# Patient Record
Sex: Female | Born: 1975 | Race: Black or African American | Hispanic: No | Smoking: Never smoker
Health system: Southern US, Community
[De-identification: ages and names within clinical notes are randomized; demographics above are authoritative.]

## PROBLEM LIST (undated history)

## (undated) DIAGNOSIS — G43909 Migraine, unspecified, not intractable, without status migrainosus: Secondary | ICD-10-CM

## (undated) DIAGNOSIS — R569 Unspecified convulsions: Secondary | ICD-10-CM

## (undated) HISTORY — PX: CHOLECYSTECTOMY: SHX55

---

## 2017-04-30 ENCOUNTER — Emergency Department: Payer: Self-pay

## 2017-04-30 ENCOUNTER — Emergency Department
Admission: EM | Admit: 2017-04-30 | Discharge: 2017-04-30 | Disposition: A | Discharge status: Home / Self Care | Payer: Self-pay | Attending: Emergency Medicine | Admitting: Emergency Medicine

## 2017-04-30 DIAGNOSIS — R569 Unspecified convulsions: Secondary | ICD-10-CM | POA: Insufficient documentation

## 2017-04-30 DIAGNOSIS — G8384 Todd's paralysis (postepileptic): Secondary | ICD-10-CM | POA: Insufficient documentation

## 2017-04-30 DIAGNOSIS — Z79899 Other long term (current) drug therapy: Secondary | ICD-10-CM | POA: Insufficient documentation

## 2017-04-30 HISTORY — DX: Migraine, unspecified, not intractable, without status migrainosus: G43.909

## 2017-04-30 HISTORY — DX: Unspecified convulsions: R56.9

## 2017-04-30 LAB — HEPATIC FUNCTION PANEL
ALK PHOS: 63 U/L (ref 38–126)
ALT: 15 U/L (ref 14–54)
AST: 26 U/L (ref 15–41)
Albumin: 3.6 g/dL (ref 3.5–5.0)
Bilirubin, Direct: <0.1 mg/dL — ABNORMAL LOW (ref 0.1–0.5)
TOTAL PROTEIN: 7.4 g/dL (ref 6.5–8.1)
Total Bilirubin: 0.6 mg/dL (ref 0.3–1.2)

## 2017-04-30 LAB — BASIC METABOLIC PANEL
ANION GAP: 7 (ref 5–15)
BUN: 8 mg/dL (ref 6–20)
CHLORIDE: 109 mmol/L (ref 101–111)
CO2: 23 mmol/L (ref 22–32)
Calcium: 8.8 mg/dL — ABNORMAL LOW (ref 8.9–10.3)
Creatinine, Ser: 0.84 mg/dL (ref 0.44–1.00)
GFR calc Af Amer: >60 mL/min (ref >60)
GLUCOSE: 85 mg/dL (ref 65–99)
POTASSIUM: 3.5 mmol/L (ref 3.5–5.1)
SODIUM: 139 mmol/L (ref 135–145)

## 2017-04-30 LAB — URINALYSIS, COMPLETE (UACMP) WITH MICROSCOPIC
Bacteria, UA: NONE SEEN
Bilirubin Urine: NEGATIVE
GLUCOSE, UA: NEGATIVE mg/dL
Hgb urine dipstick: NEGATIVE
Ketones, ur: NEGATIVE mg/dL
Leukocytes, UA: NEGATIVE
NITRITE: NEGATIVE
PH: 5 (ref 5.0–8.0)
Protein, ur: NEGATIVE mg/dL
Specific Gravity, Urine: 1.023 (ref 1.005–1.030)

## 2017-04-30 LAB — CBC WITH DIFFERENTIAL/PLATELET
BASOS ABS: 0 10*3/uL (ref 0–0.1)
BASOS PCT: 1 %
EOS ABS: 0.1 10*3/uL (ref 0–0.7)
EOS PCT: 3 %
HCT: 26.2 % — ABNORMAL LOW (ref 35.0–47.0)
HEMOGLOBIN: 8.2 g/dL — AB (ref 12.0–16.0)
LYMPHS PCT: 23 %
Lymphs Abs: 1.2 10*3/uL (ref 1.0–3.6)
MCH: 20.6 pg — ABNORMAL LOW (ref 26.0–34.0)
MCHC: 31.4 g/dL — AB (ref 32.0–36.0)
MCV: 65.6 fL — ABNORMAL LOW (ref 80.0–100.0)
MONO ABS: 0.4 10*3/uL (ref 0.2–0.9)
MONOS PCT: 9 %
NEUTROS ABS: 3.2 10*3/uL (ref 1.4–6.5)
Neutrophils Relative %: 64 %
Platelets: 302 10*3/uL (ref 150–440)
RBC: 4 MIL/uL (ref 3.80–5.20)
RDW: 20.7 % — AB (ref 11.5–14.5)
WBC: 5 10*3/uL (ref 3.6–11.0)

## 2017-04-30 LAB — HCG, QUANTITATIVE, PREGNANCY

## 2017-04-30 LAB — GLUCOSE, CAPILLARY: Glucose-Capillary: 111 mg/dL — ABNORMAL HIGH (ref 65–99)

## 2017-04-30 MED ORDER — LORAZEPAM 2 MG/ML IJ SOLN
2.0000 mg | Freq: Once | INTRAMUSCULAR | Status: AC
  Administered 2017-04-30: 2 mg via INTRAMUSCULAR
  Filled 2017-04-30: qty 1

## 2017-04-30 MED ORDER — SODIUM CHLORIDE 0.9 % IV SOLN
2000.0000 mg | Freq: Once | INTRAVENOUS | Status: AC
  Administered 2017-04-30: 2000 mg via INTRAVENOUS
  Filled 2017-04-30: qty 20

## 2017-04-30 NOTE — ED Triage Notes (Signed)
Pt brought in by [facility] from home.  Pt has hx of seizures and migraines that bring on seizures.  Pt currently taking  of keppra daily.  Pt having seizures off/on for 30-45 minutes prior to EMS arrival.  EMS gave  versed intranasal in route.  Pt had 3 more focal seizures in route with EMS.  Pt became responsive after arrival to ER.  Pt having drooping and numbness to L side of face.  Pt also having L side weakness.

## 2017-04-30 NOTE — ED Notes (Signed)
Pt discharged to home.  Family member driving.  Discharge instructions reviewed.  Verbalized understanding.  No questions or concerns at this time.  Teach back verified.  Pt in NAD.  No items left in ED.   

## 2017-04-30 NOTE — ED Provider Notes (Signed)
[facility] Emergency Department Provider Note  ____________________________________________   First MD Initiated Contact with Patient 04/30/17 1927     (approximate)  I have reviewed the triage vital signs and the nursing notes.   HISTORY  Chief Complaint Seizures  level V exemption history Limited by the patient's clinical condition  HPI Makayla Davis is a 41 y.o. female who comes to the emergency department via EMS after having multiple generalized tonic-clonic seizures over the past 45 minutes and not returning back to baseline. She is reported history of seizure disorder for a she only takes Keppra. EMS noted 3 generalized tonic-clonic seizures in route. They gave her 2 mg of midazolam as a lamb intranasally and she had her third seizure following this.   Past Medical History:  Diagnosis Date  . Migraines   . Seizures (HCC)     There are no active problems to display for this patient.   Past Surgical History:  Procedure Laterality Date  . CHOLECYSTECTOMY      Prior to Admission medications   Medication Sig Start Date End Date Taking? Authorizing Provider  citalopram (CELEXA) 40 MG tablet Take 40 mg by mouth daily.   Yes [provider]  cyclobenzaprine (FLEXERIL) 5 MG tablet Take 5 mg by mouth 2 (two) times daily as needed.   Yes [provider]  gabapentin (NEURONTIN) 600 MG tablet Take 600 mg by mouth 3 (three) times daily as needed.   Yes [provider]  levETIRAcetam (KEPPRA) 1000 MG tablet Take 1,000 mg by mouth 2 (two) times daily.   Yes [provider]  LORazepam (ATIVAN) 1 MG tablet Take 1 mg by mouth 2 (two) times daily as needed for anxiety.   Yes [provider]  naproxen (NAPROSYN) 500 MG tablet Take 500 mg by mouth 2 (two) times daily as needed for pain.   Yes [provider]    Allergies Patient has no known allergies.  No family history on file.  Social History Social  History  Substance Use Topics  . Smoking status: Never Smoker  . Smokeless tobacco: Not on file  . Alcohol use No    Review of Systems level V exemption history Limited by the patient's clinical condition  ____________________________________________   PHYSICAL EXAM:  VITAL SIGNS: ED Triage Vitals  Enc Vitals Group     BP      Pulse      Resp      Temp      Temp src      SpO2      Weight      Height      Head Circumference      Peak Flow      Pain Score      Pain Loc      Pain Edu?      Excl. in GC?     Constitutional: actively seizing with eyes deviated to the left with generalized tonic-clonic motion Eyes: PERRL EOMI. pupils midrange Head: Atraumatic. Nose: No congestion/rhinnorhea. Mouth/Throat: No trismus Neck: No stridor.   Cardiovascular: tachycardicrate, regular rhythm. Grossly normal heart sounds.  Good peripheral circulation. Respiratory: shallow frequent breaths lungs sound coarse Gastrointestinal: obese soft nontender Musculoskeletal: No lower extremity edema   Neurologic:  actively seizing Skin:  Skin is warm, dry and intact. No rash noted. Psychiatric: actively seizing    ____________________________________________   DIFFERENTIAL includes but not limited to  seizure disorder, breakthrough seizure, medication noncompliance, infection ____________________________________________   LABS (  all labs ordered are listed, but only abnormal results are displayed)  Labs Reviewed  BASIC METABOLIC PANEL - Abnormal; Notable for the following:       Result Value   Calcium 8.8 (*)    All other components within normal limits  HEPATIC FUNCTION PANEL - Abnormal; Notable for the following:    Bilirubin, Direct <0.1 (*)    All other components within normal limits  CBC WITH DIFFERENTIAL/PLATELET - Abnormal; Notable for the following:    Hemoglobin 8.2 (*)    HCT 26.2 (*)    MCV 65.6 (*)    MCH 20.6 (*)    MCHC 31.4 (*)    RDW 20.7 (*)    All other  components within normal limits  URINALYSIS, COMPLETE (UACMP) WITH MICROSCOPIC - Abnormal; Notable for the following:    Color, Urine YELLOW (*)    APPearance CLEAR (*)    Squamous Epithelial / LPF 0-5 (*)    All other components within normal limits  GLUCOSE, CAPILLARY - Abnormal; Notable for the following:    Glucose-Capillary 111 (*)    All other components within normal limits  HCG, QUANTITATIVE, PREGNANCY    blood work reviewed and interpreted by me shows microcytic anemia the labs otherwise unremarkable and she is not pregnant __________________________________________  EKG  ED ECG REPORT I, Merrily Brittle, the attending physician, personally viewed and interpreted this ECG.  Date: 04/30/2017 EKG Time:  Rate: 88 Rhythm: normal sinus rhythm QRS Axis: normal Intervals: normal ST/T Wave abnormalities: normal Narrative Interpretation: no evidence of acute ischemia  ____________________________________________  RADIOLOGY  Head CT reviewed by me shows no acute disease Chest x-ray reviewed by me shows no acute disease ____________________________________________   PROCEDURES  Procedure(s) performed: yes  Angiocath insertion Performed by: Merrily Brittle  Consent: Verbal consent obtained. Risks and benefits: risks, benefits and alternatives were discussed Time out: Immediately prior to procedure a "time out" was called to verify the correct patient, procedure, equipment, support staff and site/side marked as required.  Preparation: Patient was prepped and draped in the usual sterile fashion.  Vein Location: right external jugular   Gauge: 20  Normal blood return and flush without difficulty Patient tolerance: Patient tolerated the procedure well with no immediate complications.     Procedures  Critical Care performed: no  Observation: no ____________________________________________   INITIAL IMPRESSION / ASSESSMENT AND PLAN / ED COURSE  Pertinent  labs & imaging results that were available during my care of the patient were reviewed by me and considered in my medical decision making (see chart for details).  On arrival the patient was actively seizing concerning for status epilepticus. Intramuscular Ativan given for her seizure and I was able to obtain IV access through a right external jugular IV. The patient's seizure terminated and she woke up appropriately several minutes thereafter. She does report a recent history of upper respiratory tract infection and cough. Labs are pending.     ----------------------------------------- 8:02 PM on 04/30/2017 -----------------------------------------  The patient is now awake following her seizures. She does have left facial droop along with weakness in her left arm particularly her left leg. 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 She's never had a stroke. I do have a consult to neurology to discuss.   ----------------------------------------- 8:08 PM on 04/30/2017 -----------------------------------------  I discussed the case with Dr. Amada Jupiter on call for [facility] neurology  who recommended loading the patient with additional Keppra. He indicated that it is typical for Todd's paralysis to last up to a full hour following a seizure, however it is not uncommon for it to last significantly longer than that if the patient has had multiple seizures. ____________________________________________   ----------------------------------------- 9:40 PM on 04/30/2017 -----------------------------------------  The patient is now awake, appropriate, and her strength is back to normal. She normally gets her neurological care at the [facility] in [place] and she understands she is to follow-up within 1 week.  FINAL CLINICAL IMPRESSION(S) / ED DIAGNOSES  Final diagnoses:  Seizure  (HCC)  Todd's paralysis (postepileptic) (HCC)      NEW MEDICATIONS STARTED DURING THIS VISIT:  Discharge Medication List as of 04/30/2017  9:39 PM       Note:  This document was prepared using Dragon voice recognition software and may include unintentional dictation errors.     Merrily Brittle, MD 04/30/17 2356

## 2017-04-30 NOTE — Discharge Instructions (Signed)
Please make sure you remain well-hydrated and take your Keppra as prescribed. Please make an appointment to follow-up with your neurologist within 1 week for reexamination and return to the emergency department for any concerns whatsoever.  It was a pleasure to take care of you today, and thank you for coming to our emergency department.  If you have any questions or concerns before leaving please ask the nurse to grab me and I'm more than happy to go through your aftercare instructions again.  If you were prescribed any opioid pain medication today such as Norco, Vicodin, Percocet, morphine, hydrocodone, or oxycodone please make sure you do not drive when you are taking this medication as it can alter your ability to drive safely.  If you have any concerns once you are home that you are not improving or are in fact getting worse before you can make it to your follow-up appointment, please do not hesitate to call 911 and come back for further evaluation.  Merrily Brittle, MD  Results for orders placed or performed during the hospital encounter of 04/30/17  hCG, quantitative, pregnancy  Result Value Ref Range   hCG, Beta Chain, Quant, S <1 <5 mIU/mL  Basic metabolic panel  Result Value Ref Range   Sodium 139 135 - 145 mmol/L   Potassium 3.5 3.5 - 5.1 mmol/L   Chloride 109 101 - 111 mmol/L   CO2 23 22 - 32 mmol/L   Glucose, Bld 85 65 - 99 mg/dL   BUN 8 6 - 20 mg/dL   Creatinine, Ser 1.61 0.44 - 1.00 mg/dL   Calcium 8.8 (L) 8.9 - 10.3 mg/dL   GFR calc non Af Amer >60 >60 mL/min   GFR calc Af Amer >60 >60 mL/min   Anion gap 7 5 - 15  Hepatic function panel  Result Value Ref Range   Total Protein 7.4 6.5 - 8.1 g/dL   Albumin 3.6 3.5 - 5.0 g/dL   AST 26 15 - 41 U/L   ALT 15 14 - 54 U/L   Alkaline Phosphatase 63 38 - 126 U/L   Total Bilirubin 0.6 0.3 - 1.2 mg/dL   Bilirubin, Direct <0.9 (L) 0.1 - 0.5 mg/dL   Indirect Bilirubin NOT CALCULATED 0.3 - 0.9 mg/dL  CBC with Differential    Result Value Ref Range   WBC 5.0 3.6 - 11.0 K/uL   RBC 4.00 3.80 - 5.20 MIL/uL   Hemoglobin 8.2 (L) 12.0 - 16.0 g/dL   HCT 60.4 (L) 54.0 - 98.1 %   MCV 65.6 (L) 80.0 - 100.0 fL   MCH 20.6 (L) 26.0 - 34.0 pg   MCHC 31.4 (L) 32.0 - 36.0 g/dL   RDW 19.1 (H) 47.8 - 29.5 %   Platelets 302 150 - 440 K/uL   Neutrophils Relative % 64 %   Neutro Abs 3.2 1.4 - 6.5 K/uL   Lymphocytes Relative 23 %   Lymphs Abs 1.2 1.0 - 3.6 K/uL   Monocytes Relative 9 %   Monocytes Absolute 0.4 0.2 - 0.9 K/uL   Eosinophils Relative 3 %   Eosinophils Absolute 0.1 0 - 0.7 K/uL   Basophils Relative 1 %   Basophils Absolute 0.0 0 - 0.1 K/uL  Urinalysis, Complete w Microscopic  Result Value Ref Range   Color, Urine YELLOW (A) YELLOW   APPearance CLEAR (A) CLEAR   Specific Gravity, Urine 1.023 1.005 - 1.030   pH 5.0 5.0 - 8.0   Glucose, UA NEGATIVE NEGATIVE mg/dL  Hgb urine dipstick NEGATIVE NEGATIVE   Bilirubin Urine NEGATIVE NEGATIVE   Ketones, ur NEGATIVE NEGATIVE mg/dL   Protein, ur NEGATIVE NEGATIVE mg/dL   Nitrite NEGATIVE NEGATIVE   Leukocytes, UA NEGATIVE NEGATIVE   RBC / HPF 0-5 0 - 5 RBC/hpf   WBC, UA 0-5 0 - 5 WBC/hpf   Bacteria, UA NONE SEEN NONE SEEN   Squamous Epithelial / LPF 0-5 (A) NONE SEEN   Mucus PRESENT    Hyaline Casts, UA PRESENT   Glucose, capillary  Result Value Ref Range   Glucose-Capillary 111 (H) 65 - 99 mg/dL   Ct Head Wo Contrast  Result Date: 04/30/2017 CLINICAL DATA:  Seizures, migraine, altered level of consciousness EXAM: CT HEAD WITHOUT CONTRAST TECHNIQUE: Contiguous axial images were obtained from the base of the skull through the vertex without intravenous contrast. Sagittal and coronal MPR images reconstructed from axial data set. Next filled none COMPARISON:  None. FINDINGS: Brain: Normal ventricular morphology. No midline shift or mass effect. Normal appearance of brain parenchyma. No intracranial hemorrhage, mass lesion, evidence of acute infarction, or  extra-axial fluid collection. Vascular: Unremarkable Skull: Intact Sinuses/Orbits: Clear Other: N/A IMPRESSION: No acute intracranial abnormalities. Electronically Signed   By: Ulyses Southward M.D.   On: 04/30/2017 19:54   Dg Chest Port 1 View  Result Date: 04/30/2017 CLINICAL DATA:  Acute onset of intermittent focal seizures and left-sided facial numbness and drooping. Generalized left-sided weakness. Initial encounter. EXAM: PORTABLE CHEST 1 VIEW COMPARISON:  None. FINDINGS: The lungs are well-aerated and clear. There is no evidence of focal opacification, pleural effusion or pneumothorax. The cardiomediastinal silhouette is within normal limits. No acute osseous abnormalities are seen. IMPRESSION: No acute cardiopulmonary process seen. Electronically Signed   By: Roanna Raider M.D.   On: 04/30/2017 20:15

## 2018-07-03 IMAGING — DX DG CHEST 1V PORT
1 series · 1 of 1 positions shown · non-contrast
Comparison: None.

CLINICAL DATA: Acute onset of intermittent focal seizures and
left-sided facial numbness and drooping. Generalized left-sided
weakness. Initial encounter.

EXAM:
PORTABLE CHEST 1 VIEW

[chest ap]
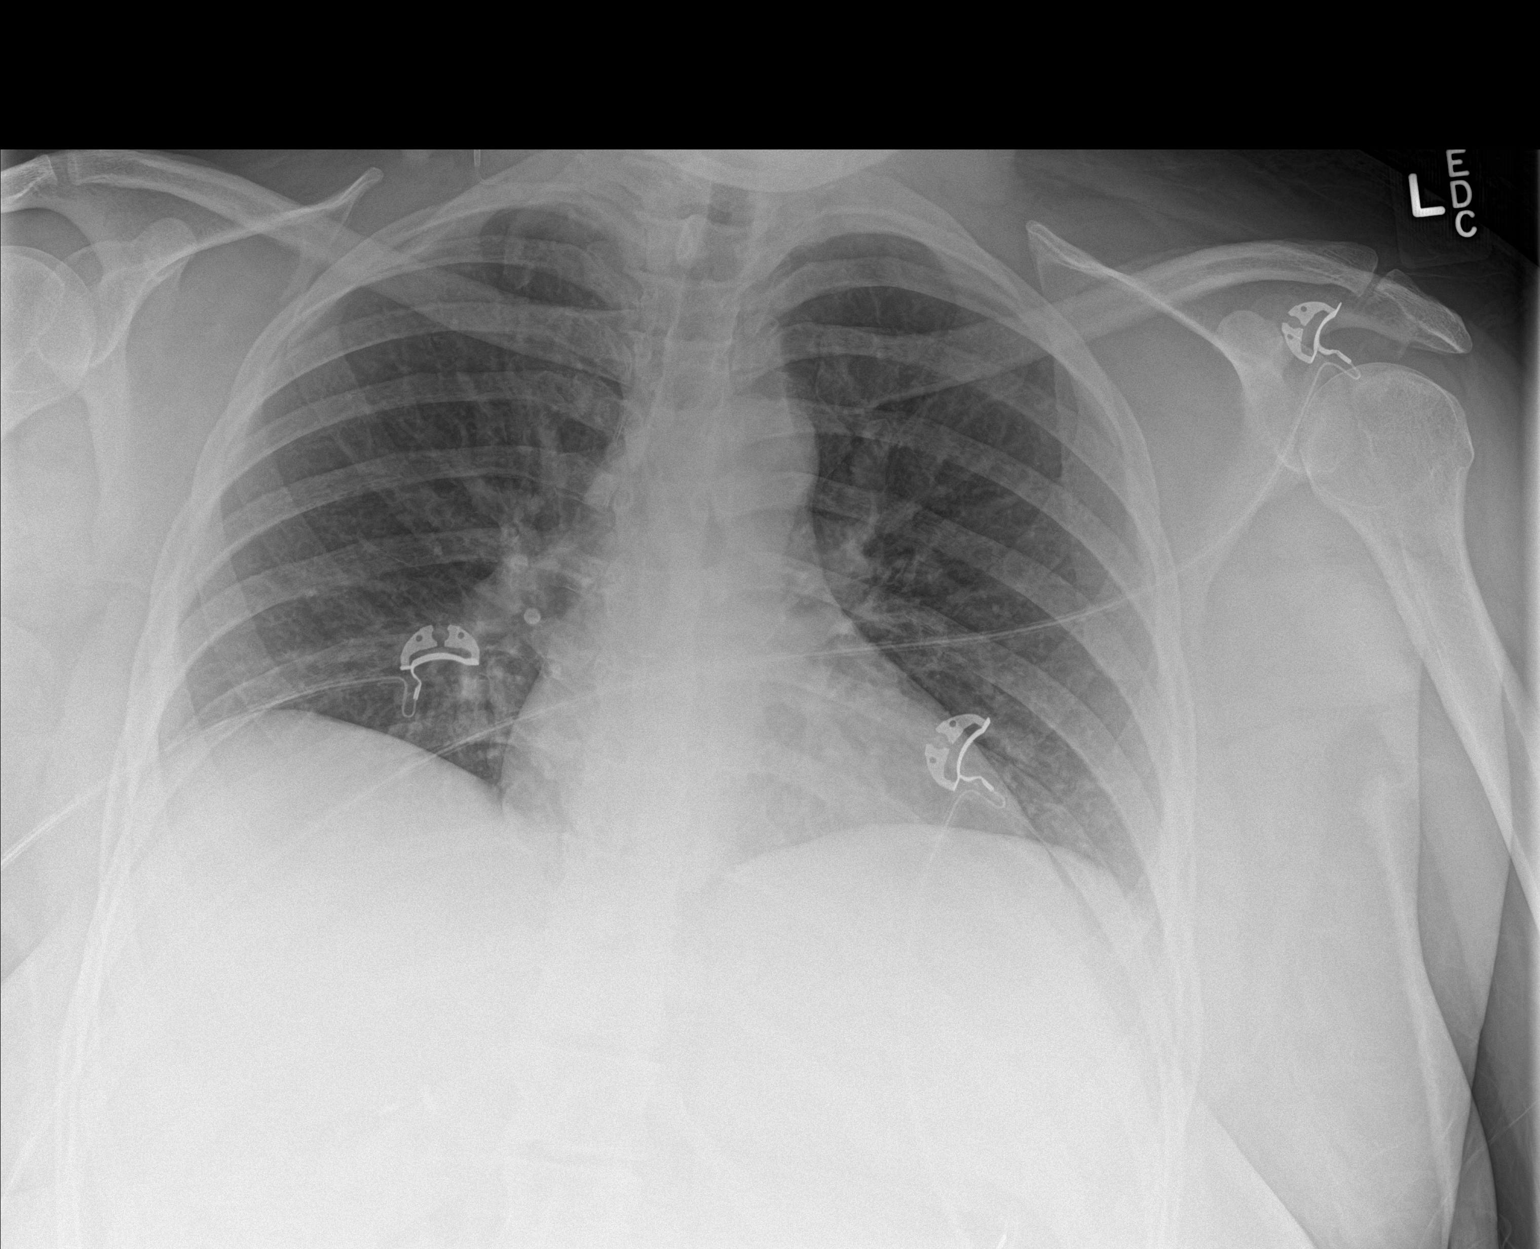

[1 of 1 positions shown; findings below may reference images not displayed]

FINDINGS: The lungs are well-aerated and clear. There is no evidence of focal
opacification, pleural effusion or pneumothorax.

The cardiomediastinal silhouette is within normal limits. No acute
osseous abnormalities are seen.
IMPRESSION: No acute cardiopulmonary process seen.

## 2018-07-03 IMAGING — CT CT HEAD W/O CM
3 series · 16 of 47 positions shown, 19 images · non-contrast
Comparison: None.

CLINICAL DATA: Seizures, migraine, altered level of consciousness

EXAM:
CT HEAD WITHOUT CONTRAST
TECHNIQUE: Contiguous axial images were obtained from the base of the skull
through the vertex without intravenous contrast. Sagittal and
coronal MPR images reconstructed from axial data set. Next filled
none

[Series 2: head wo · axial · 0.45mm/px · z∈[-80,+45]mm · 10 of 31 slices shown, 13 images]
[im 3/31  brain]
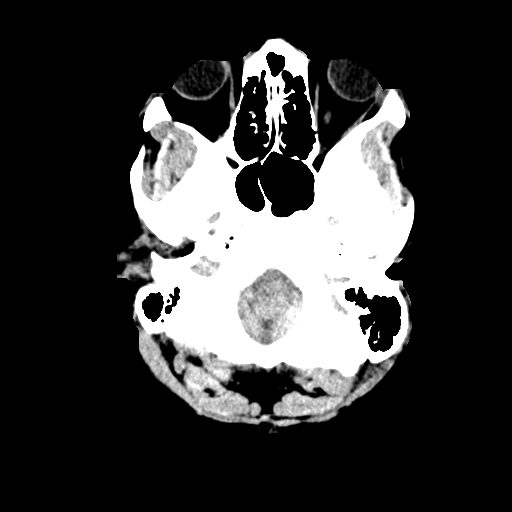
[im 3/31  bone]
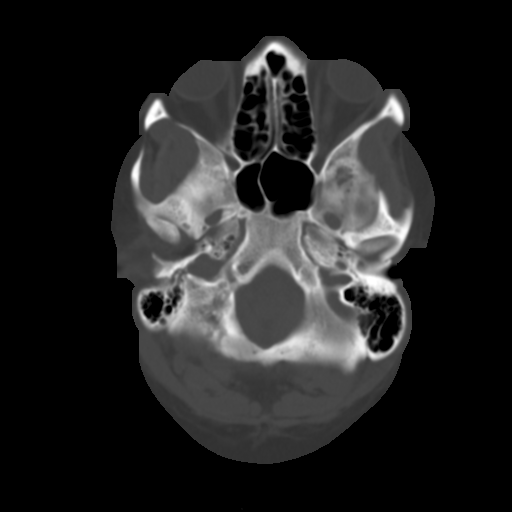
[im 6/31  brain]
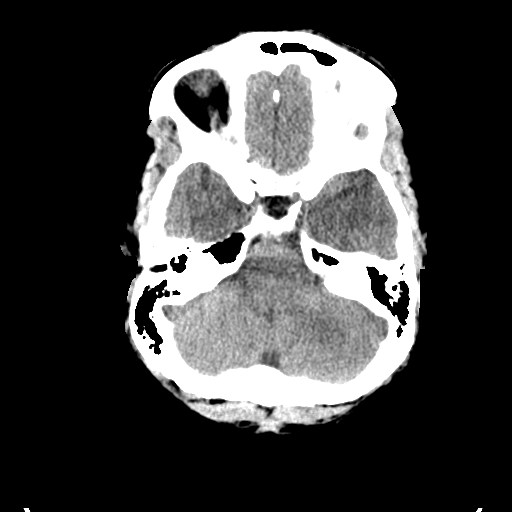
[im 9/31  brain]
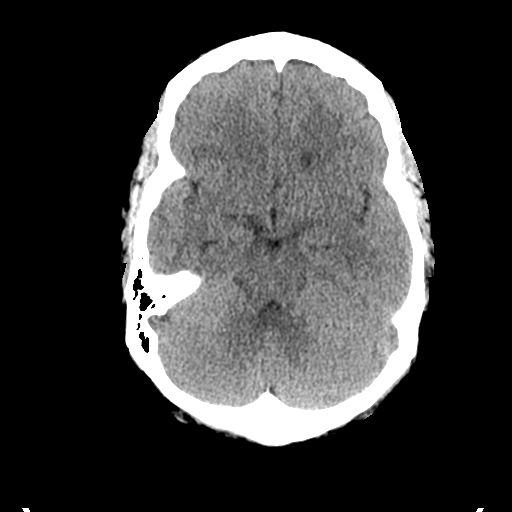
[im 11/31  brain]
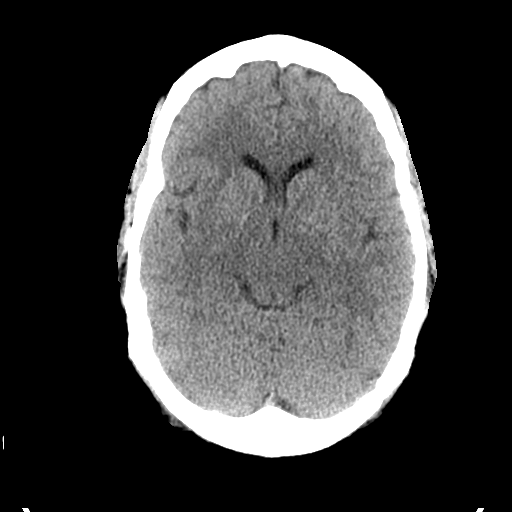
[im 14/31  brain]
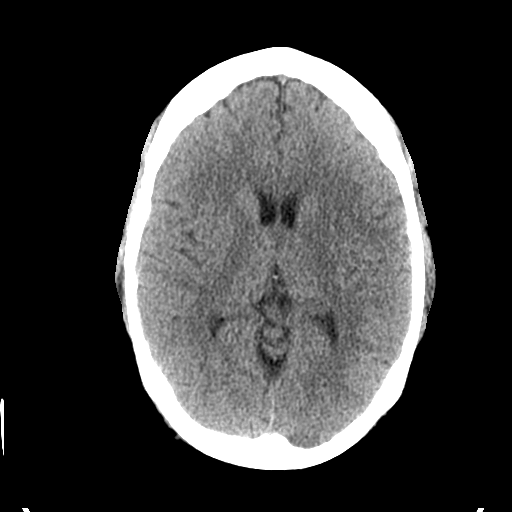
[im 14/31  bone]
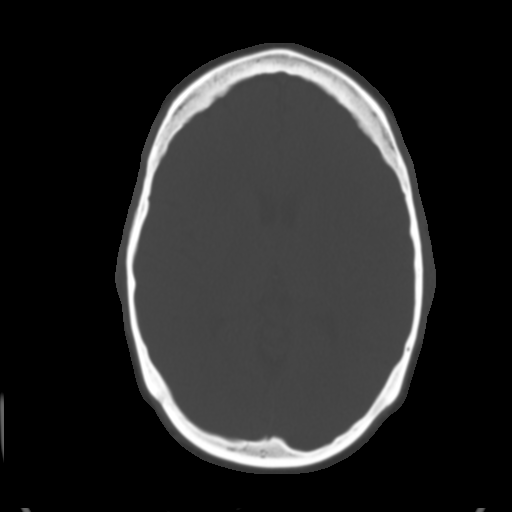
[im 17/31  brain]
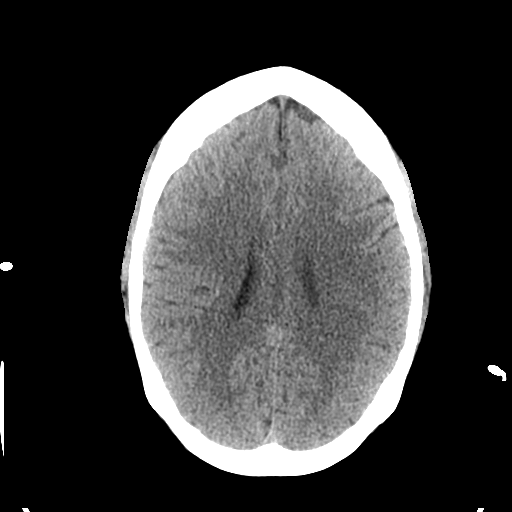
[im 20/31  brain]
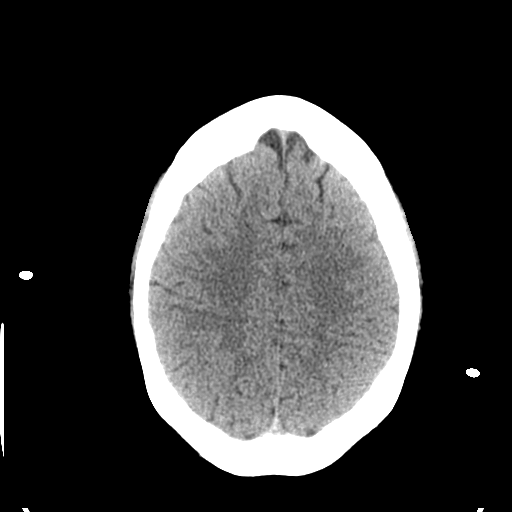
[im 23/31  brain]
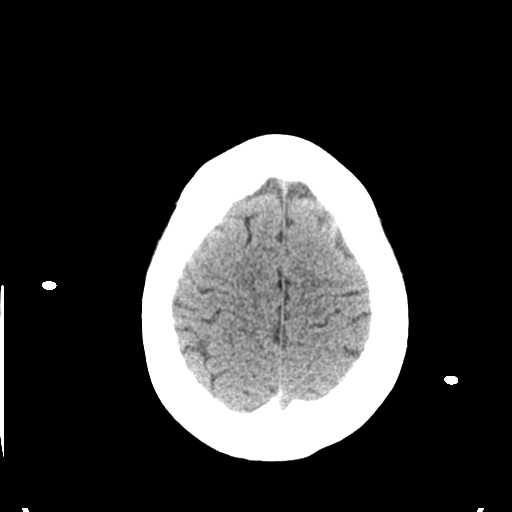
[im 25/31  brain]
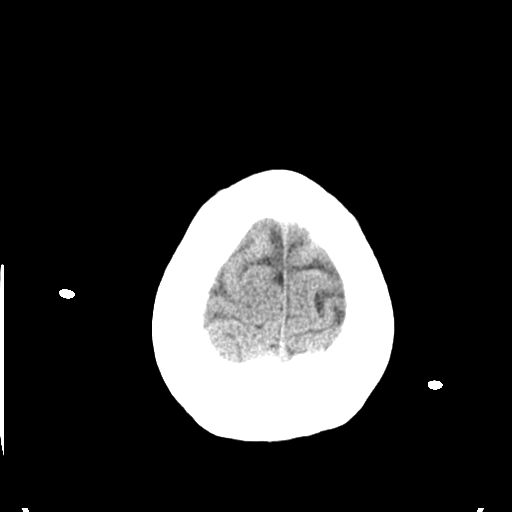
[im 25/31  bone]
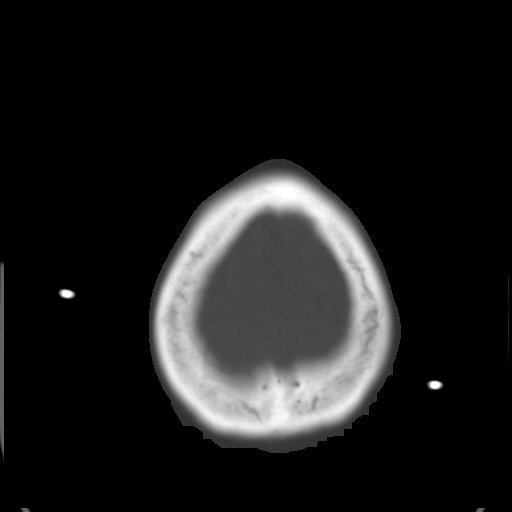
[im 28/31  brain]
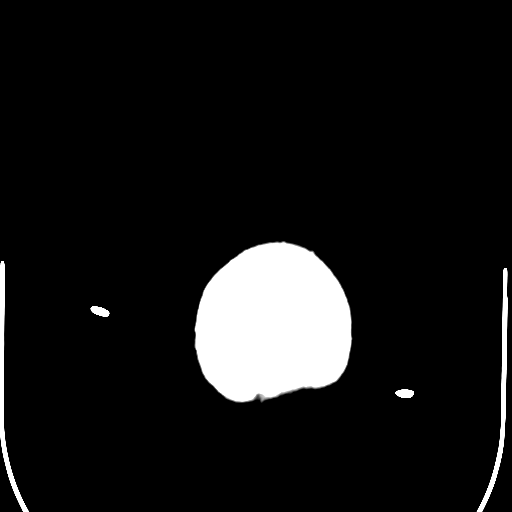

[Series 4: coronal soft tissue · coronal · 0.29mm/px · 3 of 68 slices shown]
[im 23/68  brain]
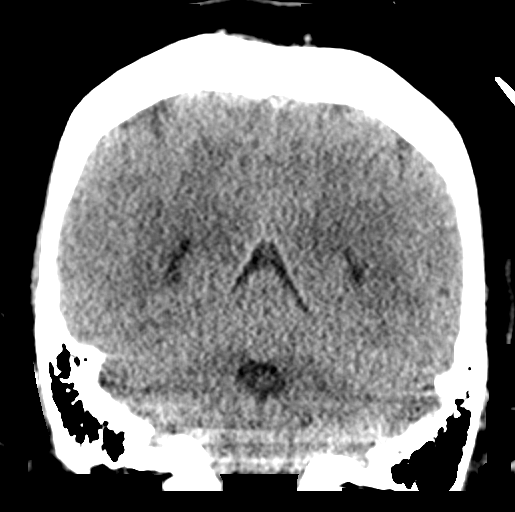
[im 30/68  brain]
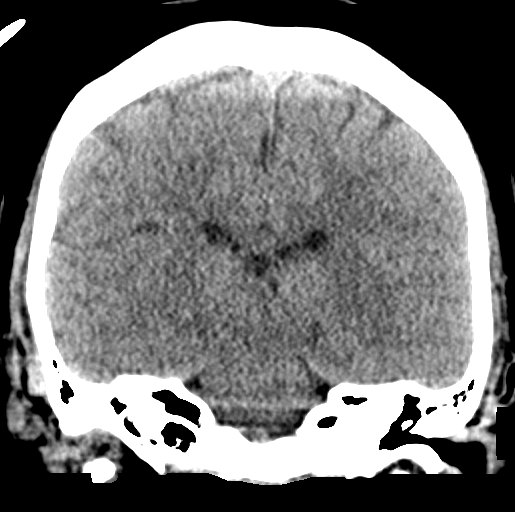
[im 38/68  brain]
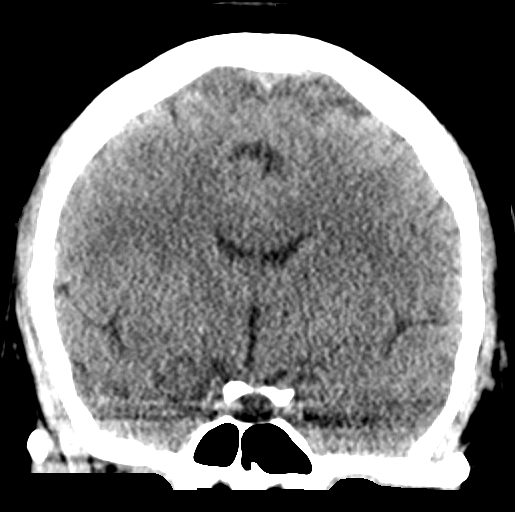

[Series 5: sagittal soft tissue · sagittal · 0.29mm/px · 3 of 51 slices shown]
[im 17/51  brain]
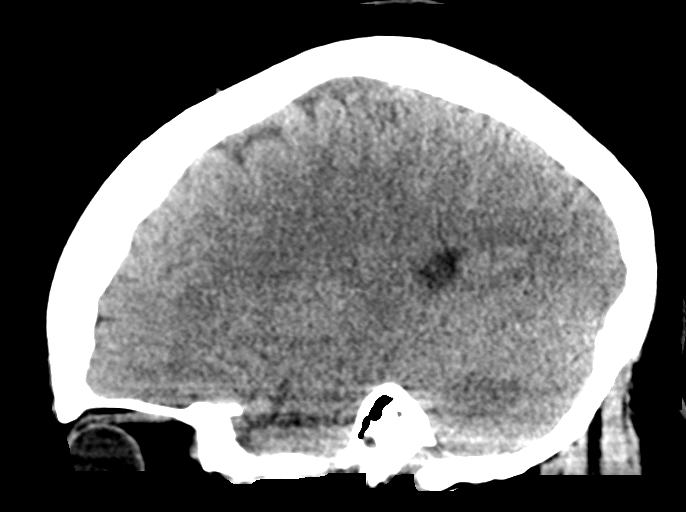
[im 26/51  brain]
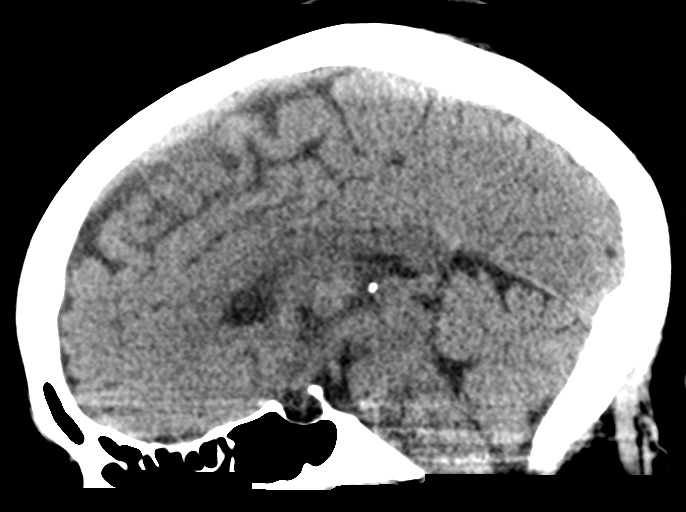
[im 34/51  brain]
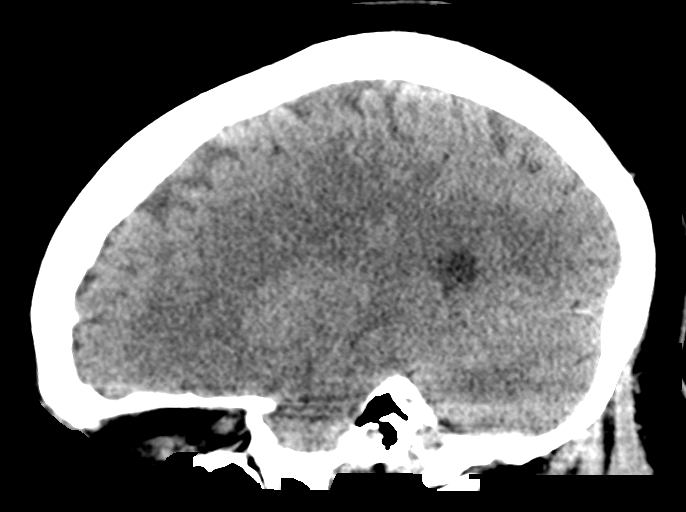

[16 of 47 positions shown; findings below may reference images not displayed]

FINDINGS: Brain: Normal ventricular morphology. No midline shift or mass
effect. Normal appearance of brain parenchyma. No intracranial
hemorrhage, mass lesion, evidence of acute infarction, or
extra-axial fluid collection.

Vascular: Unremarkable

Skull: Intact

Sinuses/Orbits: Clear

Other: N/A
IMPRESSION: No acute intracranial abnormalities.
